# Patient Record
Sex: Female | Born: 1965
Health system: Southern US, Community
[De-identification: ages and names within clinical notes are randomized; demographics above are authoritative.]

## PROBLEM LIST (undated history)

## (undated) DIAGNOSIS — Z9889 Other specified postprocedural states: Secondary | ICD-10-CM

## (undated) DIAGNOSIS — M1990 Unspecified osteoarthritis, unspecified site: Secondary | ICD-10-CM

## (undated) DIAGNOSIS — R7303 Prediabetes: Secondary | ICD-10-CM

## (undated) DIAGNOSIS — G4730 Sleep apnea, unspecified: Secondary | ICD-10-CM

## (undated) DIAGNOSIS — I10 Essential (primary) hypertension: Secondary | ICD-10-CM

---

## 2017-02-07 ENCOUNTER — Other Ambulatory Visit: Payer: Self-pay | Admitting: Internal Medicine

## 2017-02-07 DIAGNOSIS — C73 Malignant neoplasm of thyroid gland: Secondary | ICD-10-CM

## 2017-02-10 ENCOUNTER — Ambulatory Visit
Admission: RE | Admit: 2017-02-10 | Discharge: 2017-02-10 | Disposition: A | Discharge status: Home / Self Care | Payer: BLUE CROSS/BLUE SHIELD | Source: Ambulatory Visit | Attending: Internal Medicine | Admitting: Internal Medicine

## 2017-02-10 DIAGNOSIS — C73 Malignant neoplasm of thyroid gland: Secondary | ICD-10-CM

## 2017-02-18 ENCOUNTER — Other Ambulatory Visit (HOSPITAL_COMMUNITY): Payer: Self-pay | Admitting: Internal Medicine

## 2017-02-18 DIAGNOSIS — C73 Malignant neoplasm of thyroid gland: Secondary | ICD-10-CM

## 2017-02-21 ENCOUNTER — Encounter (HOSPITAL_COMMUNITY): Payer: BLUE CROSS/BLUE SHIELD

## 2017-02-22 ENCOUNTER — Encounter (HOSPITAL_COMMUNITY): Payer: BLUE CROSS/BLUE SHIELD

## 2017-02-23 ENCOUNTER — Encounter (HOSPITAL_COMMUNITY): Payer: BLUE CROSS/BLUE SHIELD

## 2017-03-02 ENCOUNTER — Encounter (HOSPITAL_COMMUNITY)
Admission: RE | Admit: 2017-03-02 | Discharge: 2017-03-02 | Disposition: A | Discharge status: Home / Self Care | Payer: BLUE CROSS/BLUE SHIELD | Source: Ambulatory Visit | Attending: Internal Medicine | Admitting: Internal Medicine

## 2017-03-02 DIAGNOSIS — C73 Malignant neoplasm of thyroid gland: Secondary | ICD-10-CM | POA: Diagnosis not present

## 2017-03-02 MED ORDER — THYROTROPIN ALFA 1.1 MG IM SOLR
0.9000 mg | INTRAMUSCULAR | Status: AC
  Administered 2017-03-02: 0.9 mg via INTRAMUSCULAR

## 2017-03-02 MED ORDER — STERILE WATER FOR INJECTION IJ SOLN
INTRAMUSCULAR | Status: AC
  Filled 2017-03-02: qty 10

## 2017-03-03 ENCOUNTER — Encounter (HOSPITAL_COMMUNITY)
Admission: RE | Admit: 2017-03-03 | Discharge: 2017-03-03 | Disposition: A | Discharge status: Home / Self Care | Payer: BLUE CROSS/BLUE SHIELD | Source: Ambulatory Visit | Attending: Internal Medicine | Admitting: Internal Medicine

## 2017-03-03 DIAGNOSIS — C73 Malignant neoplasm of thyroid gland: Secondary | ICD-10-CM | POA: Diagnosis not present

## 2017-03-03 MED ORDER — STERILE WATER FOR INJECTION IJ SOLN
INTRAMUSCULAR | Status: AC
Start: 2017-03-03 — End: 2017-03-03
  Filled 2017-03-03: qty 10

## 2017-03-03 MED ORDER — THYROTROPIN ALFA 1.1 MG IM SOLR
0.9000 mg | INTRAMUSCULAR | Status: AC
  Administered 2017-03-03: 0.9 mg via INTRAMUSCULAR

## 2017-03-04 ENCOUNTER — Ambulatory Visit (HOSPITAL_COMMUNITY): Payer: BLUE CROSS/BLUE SHIELD

## 2017-03-04 ENCOUNTER — Encounter (HOSPITAL_COMMUNITY)
Admission: RE | Admit: 2017-03-04 | Discharge: 2017-03-04 | Disposition: A | Discharge status: Home / Self Care | Payer: BLUE CROSS/BLUE SHIELD | Source: Ambulatory Visit | Attending: Internal Medicine | Admitting: Internal Medicine

## 2017-03-04 DIAGNOSIS — C73 Malignant neoplasm of thyroid gland: Secondary | ICD-10-CM | POA: Diagnosis not present

## 2017-03-04 LAB — HCG, SERUM, QUALITATIVE: Preg, Serum: NEGATIVE

## 2017-03-04 MED ORDER — SODIUM IODIDE I 131 CAPSULE
100.3000 | Freq: Once | INTRAVENOUS | Status: AC | PRN
  Administered 2017-03-04: 100.3 via ORAL

## 2017-03-14 ENCOUNTER — Encounter (HOSPITAL_COMMUNITY)
Admission: RE | Admit: 2017-03-14 | Discharge: 2017-03-14 | Disposition: A | Discharge status: Home / Self Care | Payer: BLUE CROSS/BLUE SHIELD | Source: Ambulatory Visit | Attending: Internal Medicine | Admitting: Internal Medicine

## 2017-03-14 DIAGNOSIS — C73 Malignant neoplasm of thyroid gland: Secondary | ICD-10-CM | POA: Insufficient documentation

## 2018-11-30 IMAGING — NM NM [ID] THYROID CANCER METS SP CA TX
6 series · 6 of 6 positions shown · non-contrast
Comparison: None.

CLINICAL DATA: Papillary thyroid carcinoma. 51-year-old female. TNM
staging pT3a N0. 6.5 cm greatest tumor dimension with extrathyroidal
extension, lymphovascular invasion, and positive margins.

EXAM:
NUCLEAR MEDICINE 5-3X3 POST THERAPY WHOLE BODY SCAN
TECHNIQUE: The patient received 100.3 mCi 5-3X3 sodium iodide for the treatment
of thyroid cancer within the past 10 days. The patient returns
today, and whole body scanning was performed in the anterior and
posterior projections.

[Series 1: marker · 4.14mm/px · 1 of 1 slices shown (1 of 2)]
[im 1/1  full-range]
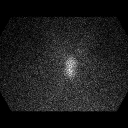

[Series 1: marker · 4.14mm/px · 1 of 1 slices shown (2 of 2)]
[im 1/1]
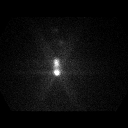

[Series 2: static thyroid no marker · 4.14mm/px · 1 of 1 slices shown (1 of 2)]
[im 1/1]
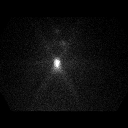

[Series 2: static thyroid no marker · 4.14mm/px · 1 of 1 slices shown (2 of 2)]
[im 1/1  full-range]
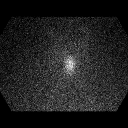

[Series 3: i131 whole body · 2.66mm/px · 1 of 1 slices shown (1 of 2)]
[im 1/1  full-range]
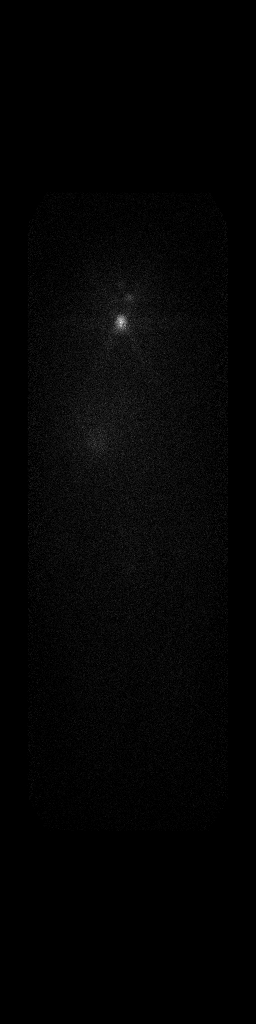

[Series 3: i131 whole body · 2.66mm/px · 1 of 1 slices shown (2 of 2)]
[im 1/1  full-range]
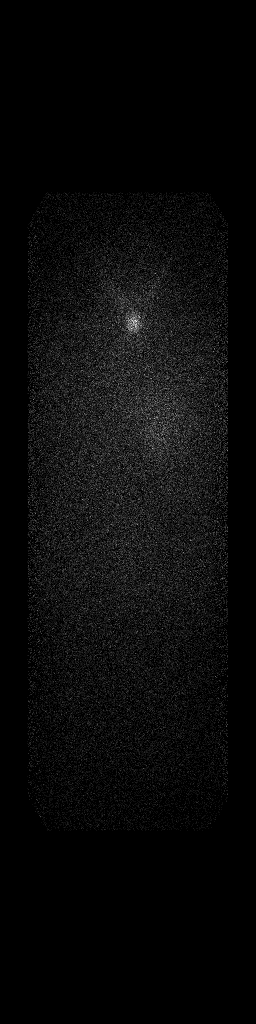

[6 of 6 positions shown; findings below may reference images not displayed]

FINDINGS: Focal activity in thyroid bed consistent remnant tissue. No clear
evidence metastatic nodes in the neck.

No abnormal accumulation of radiotracer on whole-body scan to
suggest distant metastasis. Physiologic uptake noted in the liver.
IMPRESSION: 1. No evidence distant thyroid cancer metastatic disease.
2. Remnant activity in thyroid bed.

## 2021-01-12 ENCOUNTER — Other Ambulatory Visit: Payer: Self-pay | Admitting: Internal Medicine

## 2021-01-12 DIAGNOSIS — C73 Malignant neoplasm of thyroid gland: Secondary | ICD-10-CM

## 2021-02-02 ENCOUNTER — Ambulatory Visit
Admission: RE | Admit: 2021-02-02 | Discharge: 2021-02-02 | Disposition: A | Discharge status: Home / Self Care | Payer: No Typology Code available for payment source | Source: Ambulatory Visit | Attending: Internal Medicine | Admitting: Internal Medicine

## 2021-02-02 ENCOUNTER — Ambulatory Visit
Admission: RE | Admit: 2021-02-02 | Discharge: 2021-02-02 | Disposition: A | Discharge status: Home / Self Care | Payer: Self-pay | Source: Ambulatory Visit | Attending: Internal Medicine | Admitting: Internal Medicine

## 2021-02-02 DIAGNOSIS — C73 Malignant neoplasm of thyroid gland: Secondary | ICD-10-CM

## 2021-02-02 MED ORDER — IOPAMIDOL (ISOVUE-300) INJECTION 61%
75.0000 mL | Freq: Once | INTRAVENOUS | Status: AC | PRN
  Administered 2021-02-02: 75 mL via INTRAVENOUS

## 2021-03-19 ENCOUNTER — Other Ambulatory Visit: Payer: Self-pay | Admitting: Otolaryngology

## 2021-03-19 NOTE — Pre-Procedure Instructions (Signed)
Surgical Instructions ? ? ? Your procedure is scheduled on Wednesday 03/25/21. ? ? Report to [facility] "A" at 09:30 A.M., then check in with the Admitting office. ? Call this number if you have problems the morning of surgery: ? [phone] ? ? If you have any questions prior to your surgery date call [phone]: Open Monday-Friday 8am-4pm ? ? ? Remember: ? Do not eat after midnight the night before your surgery ? ?You may drink clear liquids until 08:30 A.M. the morning of your surgery.   ?Clear liquids allowed are: Water, Non-Citrus Juices (without pulp), Carbonated Beverages, Clear Tea, Black Coffee ONLY (NO MILK, CREAM OR POWDERED CREAMER of any kind), and Gatorade ?  ? Take these medicines the morning of surgery with A SIP OF WATER:  ? levothyroxine (SYNTHROID) ? ? ?As of today, STOP taking any Aspirin (unless otherwise instructed by your surgeon) Aleve, Naproxen, Ibuprofen, Motrin, Advil, Goody's, BC's, all herbal medications, fish oil, and all vitamins. ? ?WHAT DO I DO ABOUT MY DIABETES MEDICATION? ? ? ?Do not take oral diabetes medicines (pills) the morning of surgery. ? ?DO NOT TAKE metFORMIN (GLUCOPHAGE) the morning of surgery.      ? ? ?The day of surgery, do not take other diabetes injectables, including Byetta (exenatide), Bydureon (exenatide ER), Victoza (liraglutide), or Trulicity (dulaglutide). ? ? ?HOW TO MANAGE YOUR DIABETES ?BEFORE AND AFTER SURGERY ? ?Why is it important to control my blood sugar before and after surgery? ?Improving blood sugar levels before and after surgery helps healing and can limit problems. ?A way of improving blood sugar control is eating a healthy diet by: ? Eating less sugar and carbohydrates ? Increasing activity/exercise ? Talking with your doctor about reaching your blood sugar goals ?High blood sugars (greater than 180 mg/dL) can raise your risk of infections and slow your recovery, so you will need to focus on controlling your diabetes during the  weeks before surgery. ?Make sure that the doctor who takes care of your diabetes knows about your planned surgery including the date and location. ? ?How do I manage my blood sugar before surgery? ?Check your blood sugar at least 4 times a day, starting 2 days before surgery, to make sure that the level is not too high or low. ? ?Check your blood sugar the morning of your surgery when you wake up and every 2 hours until you get to the Short Stay unit. ? ?If your blood sugar is less than 70 mg/dL, you will need to treat for low blood sugar: ?Do not take insulin. ?Treat a low blood sugar (less than 70 mg/dL) with ? cup of clear juice (cranberry or apple), 4 glucose tablets, OR glucose gel. ?Recheck blood sugar in 15 minutes after treatment (to make sure it is greater than 70 mg/dL). If your blood sugar is not greater than 70 mg/dL on recheck, call [phone] for further instructions. ?Report your blood sugar to the short stay nurse when you get to Short Stay. ? ?If you are admitted to the hospital after surgery: ?Your blood sugar will be checked by the staff and you will probably be given insulin after surgery (instead of oral diabetes medicines) to make sure you have good blood sugar levels. ?The goal for blood sugar control after surgery is 80-180 mg/dL.  ?         ?Do not wear jewelry or makeup ?Do not wear lotions, powders, perfumes/colognes, or deodorant. ?Do not shave 48 hours prior to surgery.  Men may shave face and neck. ?Do not bring valuables to the hospital. ?Do not wear nail polish, gel polish, artificial nails, or any other type of covering on natural nails (fingers and toes) ?If you have artificial nails or gel coating that need to be removed by a nail salon, please have this removed prior to surgery. Artificial nails or gel coating may interfere with anesthesia's ability to adequately monitor your vital signs. ? ?[facility] is not responsible for any belongings or valuables. .  ? ?Do NOT Smoke  (Tobacco/Vaping)  24 hours prior to your procedure ? ?If you use a CPAP at night, you may bring your mask for your overnight stay. ?  ?Contacts, glasses, hearing aids, dentures or partials may not be worn into surgery, please bring cases for these belongings ?  ?For patients admitted to the hospital, discharge time will be determined by your treatment team. ?  ?Patients discharged the day of surgery will not be allowed to drive home, and someone needs to stay with them for 24 hours. ? ?NO VISITORS WILL BE ALLOWED IN PRE-OP WHERE PATIENTS ARE PREPPED FOR SURGERY.  ONLY 1 SUPPORT PERSON MAY BE PRESENT IN THE WAITING ROOM WHILE YOU ARE IN SURGERY.  IF YOU ARE TO BE ADMITTED, ONCE YOU ARE IN YOUR ROOM YOU WILL BE ALLOWED TWO (2) VISITORS. 1 (ONE) VISITOR MAY STAY OVERNIGHT BUT MUST ARRIVE TO THE ROOM BY 8pm.  Minor children may have two parents present. Special consideration for safety and communication needs will be reviewed on a case by case basis. ? ?Special instructions:   ? ?Oral Hygiene is also important to reduce your risk of infection.  Remember - BRUSH YOUR TEETH THE MORNING OF SURGERY WITH YOUR REGULAR TOOTHPASTE ? ? ?[facility]- Preparing For Surgery ? ?Before surgery, you can play an important role. Because skin is not sterile, your skin needs to be as free of germs as possible. You can reduce the number of germs on your skin by washing with CHG (chlorahexidine gluconate) Soap before surgery.  CHG is an antiseptic cleaner which kills germs and bonds with the skin to continue killing germs even after washing.   ? ? ?Please do not use if you have an allergy to CHG or antibacterial soaps. If your skin becomes reddened/irritated stop using the CHG.  ?Do not shave (including legs and underarms) for at least 48 hours prior to first CHG shower. It is OK to shave your face. ? ?Please follow these instructions carefully. ?  ? ? Shower the NIGHT BEFORE SURGERY and the MORNING OF SURGERY with CHG Soap.  ? If you chose  to wash your hair, wash your hair first as usual with your normal shampoo. After you shampoo, rinse your hair and body thoroughly to remove the shampoo.  Then [employer] and genitals (private parts) with your normal soap and rinse thoroughly to remove soap. ? ?After that Use CHG Soap as you would any other liquid soap. You can apply CHG directly to the skin and wash gently with a scrungie or a clean washcloth.  ? ?Apply the CHG Soap to your body ONLY FROM THE NECK DOWN.  Do not use on open wounds or open sores. Avoid contact with your eyes, ears, mouth and genitals (private parts). Wash Face and genitals (private parts)  with your normal soap.  ? ?Wash thoroughly, paying special attention to the area where your surgery will be performed. ? ?Thoroughly rinse your body with warm water from  the neck down. ? ?DO NOT shower/wash with your normal soap after using and rinsing off the CHG Soap. ? ?Pat yourself dry with a CLEAN TOWEL. ? ?Wear CLEAN PAJAMAS to bed the night before surgery ? ?Place CLEAN SHEETS on your bed the night before your surgery ? ?DO NOT SLEEP WITH PETS. ? ? ?Day of Surgery: ? ?Take a shower with CHG soap. ?Wear Clean/Comfortable clothing the morning of surgery ?Do not apply any deodorants/lotions.   ?Remember to brush your teeth WITH YOUR REGULAR TOOTHPASTE. ? ? ? ?COVID testing ? ?If you are going to stay overnight or be admitted after your procedure/surgery and require a pre-op COVID test, please follow these instructions after your COVID test  ? ?You are not required to quarantine however you are required to wear a well-fitting mask when you are out and around people not in your household.  If your mask becomes wet or soiled, replace with a new one. ? ?Wash your hands often with soap and water for 20 seconds or clean your hands with an alcohol-based hand sanitizer that contains at least 60% alcohol. ? ?Do not share personal items. ? ?Notify your provider: ?if you are in close contact with someone  who has COVID  ?or if you develop a fever of 100.4 or greater, sneezing, cough, sore throat, shortness of breath or body aches. ? ?  ?Please read over the following fact sheets that you were given.  ? ?

## 2021-03-20 ENCOUNTER — Other Ambulatory Visit: Payer: Self-pay

## 2021-03-20 ENCOUNTER — Encounter (HOSPITAL_COMMUNITY)
Admission: RE | Admit: 2021-03-20 | Discharge: 2021-03-20 | Disposition: A | Discharge status: Home / Self Care | Payer: Managed Care, Other (non HMO) | Source: Ambulatory Visit | Attending: Otolaryngology | Admitting: Otolaryngology

## 2021-03-20 ENCOUNTER — Encounter (HOSPITAL_COMMUNITY): Payer: Self-pay

## 2021-03-20 VITALS — BP 139/82 | HR 91 | Temp 98.4°F | Resp 18 | Ht 65.0 in | Wt 245.2 lb

## 2021-03-20 DIAGNOSIS — Z20822 Contact with and (suspected) exposure to covid-19: Secondary | ICD-10-CM | POA: Insufficient documentation

## 2021-03-20 DIAGNOSIS — Z01818 Encounter for other preprocedural examination: Secondary | ICD-10-CM | POA: Insufficient documentation

## 2021-03-20 HISTORY — DX: Other specified postprocedural states: Z98.890

## 2021-03-20 HISTORY — DX: Essential (primary) hypertension: I10

## 2021-03-20 HISTORY — DX: Unspecified osteoarthritis, unspecified site: M19.90

## 2021-03-20 HISTORY — DX: Prediabetes: R73.03

## 2021-03-20 HISTORY — DX: Sleep apnea, unspecified: G47.30

## 2021-03-20 LAB — BASIC METABOLIC PANEL
Anion gap: 9 (ref 5–15)
BUN: 14 mg/dL (ref 6–20)
CO2: 27 mmol/L (ref 22–32)
Calcium: 9.7 mg/dL (ref 8.9–10.3)
Chloride: 104 mmol/L (ref 98–111)
Creatinine, Ser: 0.63 mg/dL (ref 0.44–1.00)
GFR, Estimated: >60 mL/min (ref >60)
Glucose, Bld: 100 mg/dL — ABNORMAL HIGH (ref 70–99)
Potassium: 4 mmol/L (ref 3.5–5.1)
Sodium: 140 mmol/L (ref 135–145)

## 2021-03-20 LAB — CBC
HCT: 43.5 % (ref 36.0–46.0)
Hemoglobin: 14 g/dL (ref 12.0–15.0)
MCH: 28.6 pg (ref 26.0–34.0)
MCHC: 32.2 g/dL (ref 30.0–36.0)
MCV: 89 fL (ref 80.0–100.0)
Platelets: 204 10*3/uL (ref 150–400)
RBC: 4.89 MIL/uL (ref 3.87–5.11)
RDW: 13 % (ref 11.5–15.5)
WBC: 9.7 10*3/uL (ref 4.0–10.5)
nRBC: 0 % (ref 0.0–0.2)

## 2021-03-20 NOTE — Pre-Procedure Instructions (Signed)
Surgical Instructions ? ? ? Your procedure is scheduled on Wednesday 03/25/21. ? ? [facility] "[facility]" at 09:30 A.M., then check in with the Admitting office. ? Call this number if you have problems the morning of surgery: ? [phone] ? ? If you have any questions prior to your surgery date call [phone]: Open Monday-Friday 8am-4pm ? ? ? Remember: ? Do not eat or drink after midnight the night before your surgery ? ? Take these medicines the morning of surgery with A SIP OF WATER:  ? ? levothyroxine (SYNTHROID) ? ? ?As of today, STOP taking any Aspirin (unless otherwise instructed by your surgeon) Aleve, Naproxen, Ibuprofen, Motrin, Advil, Goody's, BC's, all herbal medications, fish oil, and all vitamins. ? ?WHAT DO I DO ABOUT MY DIABETES MEDICATION? ? ? ?Do not take oral diabetes medicines (pills) the morning of surgery. ? ?DO NOT TAKE metFORMIN (GLUCOPHAGE) the morning of surgery.     ? ? ?HOW TO MANAGE YOUR DIABETES ?BEFORE AND AFTER SURGERY ? ?Why is it important to control my blood sugar before and after surgery? ?Improving blood sugar levels before and after surgery helps healing and can limit problems. ?A way of improving blood sugar control is eating a healthy diet by: ? Eating less sugar and carbohydrates ? Increasing activity/exercise ? Talking with your doctor about reaching your blood sugar goals ?High blood sugars (greater than 180 mg/dL) can raise your risk of infections and slow your recovery, so you will need to focus on controlling your diabetes during the weeks before surgery. ?Make sure that the doctor who takes care of your diabetes knows about your planned surgery including the date and location. ? ?How do I manage my blood sugar before surgery? ?Check your blood sugar at least 4 times a day, starting 2 days before surgery, to make sure that the level is not too high or low. ? ?Check your blood sugar the morning of your surgery when you wake up and every 2 hours until  you get to the Short Stay unit. ? ?If your blood sugar is less than 70 mg/dL, you will need to treat for low blood sugar: ?Do not take insulin. ?Treat a low blood sugar (less than 70 mg/dL) with ? cup of clear juice (cranberry or apple), 4 glucose tablets, OR glucose gel. ?Recheck blood sugar in 15 minutes after treatment (to make sure it is greater than 70 mg/dL). If your blood sugar is not greater than 70 mg/dL on recheck, call [phone] for further instructions. ?Report your blood sugar to the short stay nurse when you get to Short Stay. ? ?If you are admitted to the hospital after surgery: ?Your blood sugar will be checked by the staff and you will probably be given insulin after surgery (instead of oral diabetes medicines) to make sure you have good blood sugar levels. ?The goal for blood sugar control after surgery is 80-180 mg/dL.  ? ?The day of surgery: ?         ?Do not wear jewelry or makeup ?Do not wear lotions, powders, perfumes, or deodorant. ?Do not shave 48 hours prior to surgery.   ?Do not bring valuables to the hospital. ?Do not wear nail polish, gel polish, artificial nails, or any other type of covering on natural nails (fingers and toes) ?If you have artificial nails or gel coating that need to be removed by a nail salon, please have this removed prior to surgery. Artificial nails or gel coating may interfere  with anesthesia's ability to adequately monitor your vital signs. ? ?[facility] is not responsible for any belongings or valuables. .  ? ?Do NOT Smoke (Tobacco/Vaping)  24 hours prior to your procedure ? ?If you use a CPAP at night, you may bring your mask for your overnight stay. ?  ?Contacts, glasses, hearing aids, dentures or partials may not be worn into surgery, please bring cases for these belongings ?  ?For patients admitted to the hospital, discharge time will be determined by your treatment team. ?  ?Patients discharged the day of surgery will not be allowed to drive home, and  someone needs to stay with them for 24 hours. ? ?NO VISITORS WILL BE ALLOWED IN PRE-OP WHERE PATIENTS ARE PREPPED FOR SURGERY.  ONLY 1 SUPPORT PERSON MAY BE PRESENT IN THE WAITING ROOM WHILE YOU ARE IN SURGERY.  IF YOU ARE TO BE ADMITTED, ONCE YOU ARE IN YOUR ROOM YOU WILL BE ALLOWED TWO (2) VISITORS. 1 (ONE) VISITOR MAY STAY OVERNIGHT BUT MUST ARRIVE TO THE ROOM BY 8pm.  Minor children may have two parents present. Special consideration for safety and communication needs will be reviewed on a case by case basis. ? ?Special instructions:   ? ?Oral Hygiene is also important to reduce your risk of infection.  Remember - BRUSH YOUR TEETH THE MORNING OF SURGERY WITH YOUR REGULAR TOOTHPASTE ? ? ?[facility]- Preparing For Surgery ? ?Before surgery, you can play an important role. Because skin is not sterile, your skin needs to be as free of germs as possible. You can reduce the number of germs on your skin by washing with CHG (chlorahexidine gluconate) Soap before surgery.  CHG is an antiseptic cleaner which kills germs and bonds with the skin to continue killing germs even after washing.   ? ? ?Please do not use if you have an allergy to CHG or antibacterial soaps. If your skin becomes reddened/irritated stop using the CHG.  ?Do not shave (including legs and underarms) for at least 48 hours prior to first CHG shower. It is OK to shave your face. ? ?Please follow these instructions carefully. ?  ? ? Shower the NIGHT BEFORE SURGERY and the MORNING OF SURGERY with CHG Soap.  ? If you chose to wash your hair, wash your hair first as usual with your normal shampoo. After you shampoo, rinse your hair and body thoroughly to remove the shampoo.  Then [employer] and genitals (private parts) with your normal soap and rinse thoroughly to remove soap. ? ?After that Use CHG Soap as you would any other liquid soap. You can apply CHG directly to the skin and wash gently with a scrungie or a clean washcloth.  ? ?Apply the CHG Soap to  your body ONLY FROM THE NECK DOWN.  Do not use on open wounds or open sores. Avoid contact with your eyes, ears, mouth and genitals (private parts). Wash Face and genitals (private parts)  with your normal soap.  ? ?Wash thoroughly, paying special attention to the area where your surgery will be performed. ? ?Thoroughly rinse your body with warm water from the neck down. ? ?DO NOT shower/wash with your normal soap after using and rinsing off the CHG Soap. ? ?Pat yourself dry with a CLEAN TOWEL. ? ?Wear CLEAN PAJAMAS to bed the night before surgery ? ?Place CLEAN SHEETS on your bed the night before your surgery ? ?DO NOT SLEEP WITH PETS. ? ? ?Day of Surgery: ? ?Take a shower with  CHG soap. ?Wear Clean/Comfortable clothing the morning of surgery ?Do not apply any deodorants/lotions.   ?Remember to brush your teeth WITH YOUR REGULAR TOOTHPASTE. ? ? ? ?COVID testing ? ?If you are going to stay overnight or be admitted after your procedure/surgery and require a pre-op COVID test, please follow these instructions after your COVID test  ? ?You are not required to quarantine however you are required to wear a well-fitting mask when you are out and around people not in your household.  If your mask becomes wet or soiled, replace with a new one. ? ?Wash your hands often with soap and water for 20 seconds or clean your hands with an alcohol-based hand sanitizer that contains at least 60% alcohol. ? ?Do not share personal items. ? ?Notify your provider: ?if you are in close contact with someone who has COVID  ?or if you develop a fever of 100.4 or greater, sneezing, cough, sore throat, shortness of breath or body aches. ? ?  ?Please read over the following fact sheets that you were given.  ? ?

## 2021-03-20 NOTE — Progress Notes (Addendum)
PCP - Glendale Chard, MD ?Cardiologist - denies ? ?PPM/ICD - denies ?Device Orders - n/a ?Rep Notified - n/a ? ?Chest x-ray - n/a ?EKG - 03/20/2021 ?Stress Test - > 10 years ago - negative per patient ?ECHO - 06/25/2014 - [facility] ?Cardiac Cath - denies ? ?Sleep Study - yes, positive for OSA ?CPAP - yes ? ?Blood Thinner Instructions: n/a ? ?Aspirin Instructions: Patient was instructed: As of today, STOP taking any Aspirin (unless otherwise instructed by your surgeon) Aleve, Naproxen, Ibuprofen, Motrin, Advil, Goody's, BC's, all herbal medications, fish oil, and all vitamins. ?  ? ?ERAS Protcol - n/a ?  ?COVID TEST - done in PAT on 03/20/2021 ? ? ?Anesthesia review: yes, abnormal EKG in PAT ? ?Patient denies shortness of breath, fever, cough and chest pain at PAT appointment ? ? ?All instructions explained to the patient, with a verbal understanding of the material. Patient agrees to go over the instructions while at home for a better understanding. Patient also instructed to self quarantine after being tested for COVID-19. The opportunity to ask questions was provided. ?  ?

## 2021-03-21 LAB — SARS CORONAVIRUS 2 (TAT 6-24 HRS): SARS Coronavirus 2: NEGATIVE

## 2021-03-24 NOTE — Anesthesia Preprocedure Evaluation (Addendum)
Anesthesia Evaluation  ?Patient identified by MRN, date of birth, ID band ?Patient awake ? ? ? ?Reviewed: ?Allergy & Precautions, NPO status , Patient's Chart, lab work & pertinent test results ? ?Airway ?Mallampati: III ? ?TM Distance: >3 FB ?Neck ROM: Full ? ? ? Dental ? ?(+) Teeth Intact, Dental Advisory Given ?  ?Pulmonary ?sleep apnea , former smoker,  ?  ?Pulmonary exam normal ? ? ? ? ? ? ? Cardiovascular ?hypertension, Pt. on medications ?Normal cardiovascular exam ? ? ?  ?Neuro/Psych ?negative neurological ROS ? negative psych ROS  ? GI/Hepatic ?negative GI ROS, Neg liver ROS,   ?Endo/Other  ?diabetes, Type 2, Oral Hypoglycemic AgentsHypothyroidism Morbid obesityRecurrent follicular carcinoma of thyroid gland ?BMI 41 ? Renal/GU ?negative Renal ROS  ?negative genitourinary ?  ?Musculoskeletal ? ?(+) Arthritis , Osteoarthritis,   ? Abdominal ?  ?Peds ? Hematology ?negative hematology ROS ?(+)   ?Anesthesia Other Findings ? ? Reproductive/Obstetrics ?negative OB ROS ? ?  ? ? ? ? ? ? ? ? ? ? ? ? ? ?  ?  ? ? ? ? ? ? ? ?Anesthesia Physical ?Anesthesia Plan ? ?ASA: 3 ? ?Anesthesia Plan: General  ? ?Post-op Pain Management: Tylenol PO (pre-op)* and Toradol IV (intra-op)*  ? ?Induction: Intravenous ? ?PONV Risk Score and Plan: 4 or greater and Ondansetron, Dexamethasone, Midazolam and Treatment may vary due to age or medical condition ? ?Airway Management Planned: Oral ETT ? ?Additional Equipment: None ? ?Intra-op Plan:  ? ?Post-operative Plan: Extubation in OR ? ?Informed Consent: I have reviewed the patients History and Physical, chart, labs and discussed the procedure including the risks, benefits and alternatives for the proposed anesthesia with the patient or authorized representative who has indicated his/her understanding and acceptance.  ? ? ? ?Dental advisory given ? ?Plan Discussed with:  ? ?Anesthesia Plan Comments:   ? ? ? ? ? ?Anesthesia Quick Evaluation ? ?

## 2021-03-25 ENCOUNTER — Ambulatory Visit (HOSPITAL_BASED_OUTPATIENT_CLINIC_OR_DEPARTMENT_OTHER): Payer: Managed Care, Other (non HMO) | Admitting: Anesthesiology

## 2021-03-25 ENCOUNTER — Ambulatory Visit (HOSPITAL_COMMUNITY): Payer: Managed Care, Other (non HMO) | Admitting: Physician Assistant

## 2021-03-25 ENCOUNTER — Encounter (HOSPITAL_COMMUNITY)
Admission: RE | Disposition: A | Discharge status: Home / Self Care | Payer: Self-pay | Source: Home / Self Care | Attending: Otolaryngology

## 2021-03-25 ENCOUNTER — Other Ambulatory Visit: Payer: Self-pay

## 2021-03-25 ENCOUNTER — Ambulatory Visit (HOSPITAL_COMMUNITY)
Admission: RE | Admit: 2021-03-25 | Discharge: 2021-03-25 | Disposition: A | Discharge status: Home / Self Care | Payer: Managed Care, Other (non HMO) | Attending: Otolaryngology | Admitting: Otolaryngology

## 2021-03-25 ENCOUNTER — Encounter (HOSPITAL_COMMUNITY): Payer: Self-pay | Admitting: Otolaryngology

## 2021-03-25 DIAGNOSIS — Z87891 Personal history of nicotine dependence: Secondary | ICD-10-CM | POA: Insufficient documentation

## 2021-03-25 DIAGNOSIS — I10 Essential (primary) hypertension: Secondary | ICD-10-CM | POA: Diagnosis not present

## 2021-03-25 DIAGNOSIS — E890 Postprocedural hypothyroidism: Secondary | ICD-10-CM | POA: Insufficient documentation

## 2021-03-25 DIAGNOSIS — Z6841 Body Mass Index (BMI) 40.0 and over, adult: Secondary | ICD-10-CM | POA: Insufficient documentation

## 2021-03-25 DIAGNOSIS — C73 Malignant neoplasm of thyroid gland: Secondary | ICD-10-CM

## 2021-03-25 DIAGNOSIS — Z85850 Personal history of malignant neoplasm of thyroid: Secondary | ICD-10-CM | POA: Insufficient documentation

## 2021-03-25 DIAGNOSIS — D34 Benign neoplasm of thyroid gland: Secondary | ICD-10-CM | POA: Insufficient documentation

## 2021-03-25 DIAGNOSIS — Z7984 Long term (current) use of oral hypoglycemic drugs: Secondary | ICD-10-CM

## 2021-03-25 DIAGNOSIS — E119 Type 2 diabetes mellitus without complications: Secondary | ICD-10-CM

## 2021-03-25 HISTORY — PX: THYROIDECTOMY: SHX17

## 2021-03-25 LAB — CALCIUM: Calcium: 8.8 mg/dL — ABNORMAL LOW (ref 8.9–10.3)

## 2021-03-25 SURGERY — THYROIDECTOMY
Anesthesia: General | Site: Neck | Laterality: Right

## 2021-03-25 MED ORDER — DEXAMETHASONE SODIUM PHOSPHATE 4 MG/ML IJ SOLN
INTRAMUSCULAR | Status: DC | PRN
  Administered 2021-03-25: 10 mg via INTRAVENOUS

## 2021-03-25 MED ORDER — HYDROMORPHONE HCL 1 MG/ML IJ SOLN
0.2500 mg | INTRAMUSCULAR | Status: DC | PRN
  Administered 2021-03-25: 0.5 mg via INTRAVENOUS

## 2021-03-25 MED ORDER — KCL IN DEXTROSE-NACL 20-5-0.45 MEQ/L-%-% IV SOLN
INTRAVENOUS | Status: DC
  Filled 2021-03-25: qty 1000

## 2021-03-25 MED ORDER — ORAL CARE MOUTH RINSE: 15.0000 mL | Freq: Once | OROMUCOSAL | Status: AC

## 2021-03-25 MED ORDER — 0.9 % SODIUM CHLORIDE (POUR BTL) OPTIME
TOPICAL | Status: DC | PRN
  Administered 2021-03-25: 1000 mL

## 2021-03-25 MED ORDER — OXYCODONE HCL 5 MG/5ML PO SOLN: 5.0000 mg | Freq: Once | ORAL | Status: DC | PRN

## 2021-03-25 MED ORDER — MIDAZOLAM HCL 2 MG/2ML IJ SOLN
INTRAMUSCULAR | Status: AC
  Filled 2021-03-25: qty 2

## 2021-03-25 MED ORDER — ACETAMINOPHEN 500 MG PO TABS
1000.0000 mg | ORAL_TABLET | Freq: Once | ORAL | Status: AC
  Administered 2021-03-25: 1000 mg via ORAL
  Filled 2021-03-25: qty 2

## 2021-03-25 MED ORDER — AMISULPRIDE (ANTIEMETIC) 5 MG/2ML IV SOLN: 10.0000 mg | Freq: Once | INTRAVENOUS | Status: DC | PRN

## 2021-03-25 MED ORDER — KETOROLAC TROMETHAMINE 30 MG/ML IJ SOLN
INTRAMUSCULAR | Status: AC
  Filled 2021-03-25: qty 1

## 2021-03-25 MED ORDER — LIDOCAINE-EPINEPHRINE 1 %-1:100000 IJ SOLN
INTRAMUSCULAR | Status: DC | PRN
  Administered 2021-03-25: 5 mL

## 2021-03-25 MED ORDER — ONDANSETRON HCL 4 MG/2ML IJ SOLN
4.0000 mg | INTRAMUSCULAR | Status: DC | PRN
  Administered 2021-03-25: 4 mg via INTRAVENOUS
  Filled 2021-03-25: qty 2

## 2021-03-25 MED ORDER — HYDROCODONE-ACETAMINOPHEN 5-325 MG PO TABS
1.0000 | ORAL_TABLET | ORAL | Status: DC | PRN
  Administered 2021-03-25: 2 via ORAL
  Filled 2021-03-25: qty 2

## 2021-03-25 MED ORDER — CHLORHEXIDINE GLUCONATE 0.12 % MT SOLN
15.0000 mL | Freq: Once | OROMUCOSAL | Status: AC
  Administered 2021-03-25: 15 mL via OROMUCOSAL
  Filled 2021-03-25: qty 15

## 2021-03-25 MED ORDER — HYDROMORPHONE HCL 1 MG/ML IJ SOLN
INTRAMUSCULAR | Status: AC
  Filled 2021-03-25: qty 1

## 2021-03-25 MED ORDER — CEFAZOLIN SODIUM-DEXTROSE 2-4 GM/100ML-% IV SOLN
2.0000 g | INTRAVENOUS | Status: AC
  Administered 2021-03-25: 2 g via INTRAVENOUS
  Filled 2021-03-25: qty 100

## 2021-03-25 MED ORDER — HYDROCODONE-ACETAMINOPHEN 7.5-325 MG PO TABS: 1.0000 | ORAL_TABLET | Freq: Four times a day (QID) | ORAL | 0 refills | Status: AC | PRN

## 2021-03-25 MED ORDER — LIDOCAINE-EPINEPHRINE 1 %-1:100000 IJ SOLN
INTRAMUSCULAR | Status: AC
  Filled 2021-03-25: qty 1

## 2021-03-25 MED ORDER — MIDAZOLAM HCL 5 MG/5ML IJ SOLN
INTRAMUSCULAR | Status: DC | PRN
  Administered 2021-03-25: 2 mg via INTRAVENOUS

## 2021-03-25 MED ORDER — LACTATED RINGERS IV SOLN
INTRAVENOUS | Status: DC
Start: 2021-03-25 — End: 2021-03-25

## 2021-03-25 MED ORDER — LIDOCAINE 2% (20 MG/ML) 5 ML SYRINGE
INTRAMUSCULAR | Status: DC | PRN
  Administered 2021-03-25: 60 mg via INTRAVENOUS

## 2021-03-25 MED ORDER — OXYCODONE HCL 5 MG PO TABS: 5.0000 mg | ORAL_TABLET | Freq: Once | ORAL | Status: DC | PRN

## 2021-03-25 MED ORDER — PROPOFOL 10 MG/ML IV BOLUS
INTRAVENOUS | Status: DC | PRN
  Administered 2021-03-25: 200 mg via INTRAVENOUS

## 2021-03-25 MED ORDER — PROPOFOL 10 MG/ML IV BOLUS
INTRAVENOUS | Status: AC
  Filled 2021-03-25: qty 20

## 2021-03-25 MED ORDER — IBUPROFEN 100 MG/5ML PO SUSP: 400.0000 mg | Freq: Four times a day (QID) | ORAL | Status: DC | PRN

## 2021-03-25 MED ORDER — FENTANYL CITRATE (PF) 100 MCG/2ML IJ SOLN
INTRAMUSCULAR | Status: DC | PRN
  Administered 2021-03-25: 50 ug via INTRAVENOUS
  Administered 2021-03-25 (×2): 100 ug via INTRAVENOUS

## 2021-03-25 MED ORDER — PHENYLEPHRINE 40 MCG/ML (10ML) SYRINGE FOR IV PUSH (FOR BLOOD PRESSURE SUPPORT)
PREFILLED_SYRINGE | INTRAVENOUS | Status: DC | PRN
  Administered 2021-03-25 (×2): 80 ug via INTRAVENOUS

## 2021-03-25 MED ORDER — ONDANSETRON HCL 4 MG/2ML IJ SOLN
INTRAMUSCULAR | Status: DC | PRN
  Administered 2021-03-25: 4 mg via INTRAVENOUS

## 2021-03-25 MED ORDER — FENTANYL CITRATE (PF) 250 MCG/5ML IJ SOLN
INTRAMUSCULAR | Status: AC
  Filled 2021-03-25: qty 5

## 2021-03-25 MED ORDER — SUCCINYLCHOLINE CHLORIDE 200 MG/10ML IV SOSY
PREFILLED_SYRINGE | INTRAVENOUS | Status: DC | PRN
  Administered 2021-03-25: 120 mg via INTRAVENOUS

## 2021-03-25 MED ORDER — ONDANSETRON HCL 4 MG/2ML IJ SOLN: 4.0000 mg | Freq: Once | INTRAMUSCULAR | Status: DC | PRN

## 2021-03-25 MED ORDER — ONDANSETRON HCL 4 MG PO TABS: 4.0000 mg | ORAL_TABLET | ORAL | Status: DC | PRN

## 2021-03-25 MED ORDER — PHENYLEPHRINE HCL-NACL 20-0.9 MG/250ML-% IV SOLN
INTRAVENOUS | Status: DC | PRN
  Administered 2021-03-25: 75 ug/min via INTRAVENOUS

## 2021-03-25 SURGICAL SUPPLY — 44 items
BAG COUNTER SPONGE SURGICOUNT (BAG) ×2 IMPLANT
BLADE SURG 15 STRL LF DISP TIS (BLADE) IMPLANT
BLADE SURG 15 STRL SS (BLADE)
CANISTER SUCT 3000ML PPV (MISCELLANEOUS) ×2 IMPLANT
CLEANER TIP ELECTROSURG 2X2 (MISCELLANEOUS) ×2 IMPLANT
CNTNR URN SCR LID CUP LEK RST (MISCELLANEOUS) IMPLANT
CONT SPEC 4OZ STRL OR WHT (MISCELLANEOUS)
CORD BIPOLAR FORCEPS 12FT (ELECTRODE) ×2 IMPLANT
COVER SURGICAL LIGHT HANDLE (MISCELLANEOUS) ×2 IMPLANT
DERMABOND ADVANCED (GAUZE/BANDAGES/DRESSINGS) ×1
DERMABOND ADVANCED .7 DNX12 (GAUZE/BANDAGES/DRESSINGS) ×1 IMPLANT
DRAIN JACKSON RD 7FR 3/32 (WOUND CARE) IMPLANT
DRAIN SNY 10 ROU (WOUND CARE) IMPLANT
DRAPE HALF SHEET 40X57 (DRAPES) IMPLANT
ELECT COATED BLADE 2.86 ST (ELECTRODE) ×2 IMPLANT
ELECT REM PT RETURN 9FT ADLT (ELECTROSURGICAL) ×2
ELECTRODE REM PT RTRN 9FT ADLT (ELECTROSURGICAL) ×1 IMPLANT
EVACUATOR SILICONE 100CC (DRAIN) ×2 IMPLANT
FORCEPS BIPOLAR SPETZLER 8 1.0 (NEUROSURGERY SUPPLIES) ×2 IMPLANT
GAUZE 4X4 16PLY ~~LOC~~+RFID DBL (SPONGE) ×2 IMPLANT
GLOVE SURG ENC MOIS LTX SZ7.5 (GLOVE) ×2 IMPLANT
GOWN STRL REUS W/ TWL LRG LVL3 (GOWN DISPOSABLE) ×2 IMPLANT
GOWN STRL REUS W/TWL LRG LVL3 (GOWN DISPOSABLE) ×2
HEMOSTAT SURGICEL 2X14 (HEMOSTASIS) ×2 IMPLANT
KIT BASIN OR (CUSTOM PROCEDURE TRAY) ×2 IMPLANT
KIT TURNOVER KIT B (KITS) ×2 IMPLANT
LOCATOR NERVE 3 VOLT (DISPOSABLE) IMPLANT
NDL HYPO 25GX1X1/2 BEV (NEEDLE) ×1 IMPLANT
NEEDLE HYPO 25GX1X1/2 BEV (NEEDLE) ×2 IMPLANT
NS IRRIG 1000ML POUR BTL (IV SOLUTION) ×2 IMPLANT
PAD ARMBOARD 7.5X6 YLW CONV (MISCELLANEOUS) ×4 IMPLANT
PENCIL SMOKE EVACUATOR (MISCELLANEOUS) ×2 IMPLANT
POSITIONER HEAD DONUT 9IN (MISCELLANEOUS) IMPLANT
SHEARS HARMONIC 9CM CVD (BLADE) ×2 IMPLANT
SPONGE INTESTINAL PEANUT (DISPOSABLE) IMPLANT
STAPLER VISISTAT 35W (STAPLE) ×2 IMPLANT
SUT ETHILON 2 0 FS 18 (SUTURE) ×2 IMPLANT
SUT SILK 3 0 REEL (SUTURE) ×2 IMPLANT
SUT VIC AB 3-0 SH 27 (SUTURE) ×1
SUT VIC AB 3-0 SH 27X BRD (SUTURE) ×1 IMPLANT
SUT VICRYL 4-0 PS2 18IN ABS (SUTURE) ×2 IMPLANT
TOWEL GREEN STERILE FF (TOWEL DISPOSABLE) ×2 IMPLANT
TRAY ENT MC OR (CUSTOM PROCEDURE TRAY) ×2 IMPLANT
TUBE FEEDING 10FR FLEXIFLO (MISCELLANEOUS) IMPLANT

## 2021-03-25 NOTE — Transfer of Care (Signed)
Immediate Anesthesia Transfer of Care Note ? ?Patient: [facility] ? ?Procedure(s) Performed: EXCISION OF THYROID BED MALIGNANT MASS (Right: Neck) ? ?Patient Location: PACU ? ?Anesthesia Type:General ? ?Level of Consciousness: awake and alert  ? ?Airway & Oxygen Therapy: Patient Spontanous Breathing and Patient connected to face mask oxygen ? ?Post-op Assessment: Report given to RN and Post -op Vital signs reviewed and stable ? ?Post vital signs: Reviewed and stable ? ?Last Vitals:  ?Vitals Value Taken Time  ?BP 132/62 03/25/21 1341  ?Temp 36.7 ?C 03/25/21 1340  ?Pulse 87 03/25/21 1344  ?Resp 14 03/25/21 1344  ?SpO2 98 % 03/25/21 1344  ?Vitals shown include unvalidated device data. ? ?Last Pain:  ?Vitals:  ? 03/25/21 0959  ?TempSrc:   ?PainSc: 0-No pain  ?   ? ?  ? ?Complications: No notable events documented. ?

## 2021-03-25 NOTE — Anesthesia Postprocedure Evaluation (Signed)
Anesthesia Post Note ? ?Patient: [facility] ? ?Procedure(s) Performed: EXCISION OF THYROID BED MALIGNANT MASS (Right: Neck) ? ?  ? ?Patient location during evaluation: PACU ?Anesthesia Type: General ?Level of consciousness: awake and alert ?Pain management: pain level controlled ?Vital Signs Assessment: post-procedure vital signs reviewed and stable ?Respiratory status: spontaneous breathing, nonlabored ventilation and respiratory function stable ?Cardiovascular status: blood pressure returned to baseline and stable ?Postop Assessment: no apparent nausea or vomiting ?Anesthetic complications: no ? ? ?No notable events documented. ? ?Last Vitals:  ?Vitals:  ? 03/25/21 1425 03/25/21 1445  ?BP: 120/67 124/72  ?Pulse: 78 85  ?Resp: 16 18  ?Temp:  36.7 ?C  ?SpO2: 95% 97%  ?  ?Last Pain:  ?Vitals:  ? 03/25/21 1445  ?TempSrc: Oral  ?PainSc: 7   ? ? ?  ?  ?  ?  ?  ?  ? ?Lidia Collum ? ? ? ? ?

## 2021-03-25 NOTE — Discharge Summary (Signed)
Physician Discharge Summary  ?Patient ID: ?Amy Ellison ?MRN: [id number] ?DOB/AGE: 1965/09/03 56 y.o. ? ?Admit date: 03/25/2021 ?Discharge date: 03/25/2021 ? ?Admission Diagnoses: Recurrent follicular thyroid cancer ? ?Discharge Diagnoses:  ?Principal Problem: ?  Thyroid cancer ([place]) ? ? ?Discharged Condition: good ? ?Hospital Course: 56 year old female presented for surgical resection of recurrent follicular thyroid cancer.  See operative note.  She was observed after surgery and did well.  Her calcium level was checked and looked fairly good.  She was felt stable for discharge. ? ?Consults: None ? ?Significant Diagnostic Studies: None ? ?Treatments: surgery: Resection of malignant mass from thyroid bed ? ?Discharge Exam: ?Blood pressure 124/72, pulse 85, temperature 98.1 ?F (36.7 ?C), temperature source Oral, resp. rate 18, height '5\' 5"'$  (1.651 m), weight 108.9 kg, SpO2 97 %. ?General appearance: alert, cooperative, and no distress ?Neck: thyroidectomy incision clean and intact ? ?Disposition: Discharge disposition: 01-Home or Self Care ? ? ? ? ? ? ?Discharge Instructions   ? ? Diet - low sodium heart healthy   Complete by: As directed ?  ? Discharge instructions   Complete by: As directed ?  ? Avoid strenuous activity.  Regular diet.  OK to allow incision to get wet, gently pat dry.  Do not apply ointment to incision.  ? Increase activity slowly   Complete by: As directed ?  ? No wound care   Complete by: As directed ?  ? ?  ? ?Allergies as of 03/25/2021   ?No Known Allergies ?  ? ?  ?Medication List  ?  ? ?TAKE these medications   ? ?amLODipine 10 MG tablet ?Commonly known as: NORVASC ?Take 10 mg by mouth at bedtime. ?  ?HYDROcodone-acetaminophen 7.5-325 MG tablet ?Commonly known as: Norco ?Take 1 tablet by mouth every 6 (six) hours as needed for moderate pain. ?  ?levothyroxine 150 MCG tablet ?Commonly known as: SYNTHROID ?Take 150 mcg by mouth daily before breakfast. ?  ?lisinopril-hydrochlorothiazide 20-25 MG  tablet ?Commonly known as: ZESTORETIC ?Take 1 tablet by mouth daily. ?  ?metFORMIN 1000 MG tablet ?Commonly known as: GLUCOPHAGE ?Take 1,000 mg by mouth daily. ?  ?ZINC-VITAMIN C PO ?Take 2 tablets by mouth daily. With vitamin d ?  ? ?  ? ? Follow-up Information   ? ? Melida Quitter, MD. Schedule an appointment as soon as possible for a visit in 1 week(s).   ?Specialty: Otolaryngology ?Contact information: ?[address] ?[address] ?[place] [address] ?[phone] ? ? ?  ?  ? ?  ?  ? ?  ? ? ?Signed: ?Melida Quitter ?03/25/2021, 5:18 PM ? ? ?

## 2021-03-25 NOTE — Anesthesia Procedure Notes (Signed)
Procedure Name: Intubation ?Date/Time: 03/25/2021 12:28 PM ?Performed by: Kyung Rudd, CRNA ?Pre-anesthesia Checklist: Patient identified, Emergency Drugs available, Suction available and Patient being monitored ?Patient Re-evaluated:Patient Re-evaluated prior to induction ?Oxygen Delivery Method: Circle system utilized ?Preoxygenation: Pre-oxygenation with 100% oxygen ?Induction Type: IV induction ?Ventilation: Mask ventilation without difficulty ?Laryngoscope Size: Mac and 4 ?Grade View: Grade I ?Tube type: Oral ?Tube size: 8.0 mm ?Number of attempts: 1 ?Airway Equipment and Method: Stylet and Oral airway ?Placement Confirmation: ETT inserted through vocal cords under direct vision, positive ETCO2 and breath sounds checked- equal and bilateral ?Secured at: 20 cm ?Tube secured with: Tape ?Dental Injury: Teeth and Oropharynx as per pre-operative assessment  ? ? ? ? ?

## 2021-03-25 NOTE — H&P (Signed)
Amy Ellison is an 56 y.o. female.   ?Chief Complaint: Follicular thyroid cancer ?HPI: 56 year old female who underwent total thyroidectomy in late 0017 for follicular cancer followed by radioactive iodine.  She was recently found to have a recurrence in the right thyroid bed. ? ?Past Medical History:  ?Diagnosis Date  ? Arthritis   ? History of thyroid surgery   ? Hypertension   ? Pre-diabetes   ? Sleep apnea   ? ? ?History reviewed. No pertinent surgical history. ? ?History reviewed. No pertinent family history. ?Social History:  reports that she has quit smoking. Her smoking use included cigarettes. She has never used smokeless tobacco. No history on file for alcohol use and drug use. ? ?Allergies: No Known Allergies ? ?Medications Prior to Admission  ?Medication Sig Dispense Refill  ? amLODipine (NORVASC) 10 MG tablet Take 10 mg by mouth at bedtime.    ? levothyroxine (SYNTHROID) 150 MCG tablet Take 150 mcg by mouth daily before breakfast.    ? lisinopril-hydrochlorothiazide (ZESTORETIC) 20-25 MG tablet Take 1 tablet by mouth daily.    ? metFORMIN (GLUCOPHAGE) 1000 MG tablet Take 1,000 mg by mouth daily.    ? ZINC-VITAMIN C PO Take 2 tablets by mouth daily. With vitamin d    ? ? ?No results found for this or any previous visit (from the past 48 hour(s)). ?No results found. ? ?Review of Systems  ?Musculoskeletal:  Positive for arthralgias.  ?All other systems reviewed and are negative. ? ?Blood pressure (!) 150/86, pulse 83, temperature 98.5 ?F (36.9 ?C), temperature source Oral, resp. rate 17, height '5\' 5"'$  (1.651 m), weight 108.9 kg, SpO2 99 %. ?Physical Exam ?Constitutional:   ?   Appearance: Normal appearance.  ?HENT:  ?   Head: Normocephalic and atraumatic.  ?   Right Ear: External ear normal.  ?   Left Ear: External ear normal.  ?   Nose: Nose normal.  ?   Mouth/Throat:  ?   Mouth: Mucous membranes are moist.  ?   Pharynx: Oropharynx is clear.  ?Eyes:  ?   Extraocular Movements: Extraocular movements  intact.  ?   Conjunctiva/sclera: Conjunctivae normal.  ?   Pupils: Pupils are equal, round, and reactive to light.  ?Cardiovascular:  ?   Rate and Rhythm: Normal rate.  ?Pulmonary:  ?   Effort: Pulmonary effort is normal.  ?Musculoskeletal:  ?   Cervical back: Normal range of motion.  ?Skin: ?   General: Skin is warm and dry.  ?Neurological:  ?   General: No focal deficit present.  ?   Mental Status: She is alert and oriented to person, place, and time.  ?Psychiatric:     ?   Mood and Affect: Mood normal.     ?   Behavior: Behavior normal.     ?   Thought Content: Thought content normal.     ?   Judgment: Judgment normal.  ?  ? ?Assessment/Plan ?Recurrent follicular thyroid cancer ? ?To OR for excision of right thyroid bed mass.  Overnight observation. ? ?Melida Quitter, MD ?03/25/2021, 11:52 AM ? ? ? ?

## 2021-03-25 NOTE — Plan of Care (Signed)

## 2021-03-25 NOTE — Op Note (Signed)
Preop diagnosis: Recurrent follicular thyroid cancer ?Postop diagnosis: same ?Procedure: Excision of right thyroid bed malignant mass ?Surgeon: Redmond Baseman ?Assist: RNFA ?Anesth: General and local with 1% lidocaine with 1:100,000 epinephrine ?Compl: None ?Findings: Bilobed mass to right of midline lying over strap muscles.  Recurrent laryngeal nerves were not encountered. ?Description:  After discussing risks, benefits, and alternatives, the patient was brought to the operative suite and placed on the operative table in the supine position.  Anesthesia was induced and the patient was intubated by the anesthesia team without difficulty using a nerve monitoring endotracheal tube that was monitored during the case.  The eyes were taped closed and a shoulder roll was placed.  The thyroidectomy scar was injected with local anesthetic.  The neck was prepped and draped in sterile fashion.  The incision was made with a 15 blade scalpel and extended through the subcutaneous and platysma layers with Bovie electrocautery.  Soft tissues were dissected from over the palpable mass.  Careful dissection was then performed around the periphery of the mass, primarily dividing muscle from the mass.  This continued until the mass was freed down to a midline extension toward the trachea that was dissected free.  The mass was passed to nursing for pathology.  Bleeding was controlled with bipolar electrocautery and ligation.  The wound was copiously irrigated with saline.  The platysma layer was closed with 3-0 Vicryl in a simple, interrupted fashion.  The subcutaneous layer was similarly closed with 4-0 Vicryl and the skin was closed with glue.  Drapes were removed and the patient was cleaned off.  She was returned to anesthesia for wake-up, was extubated, and moved to the recovery room in stable condition. ? ?

## 2021-03-25 NOTE — Discharge Instructions (Signed)
Watch for signs of low calcium, tingling around mouth or in fingers or cramping of muscles in hands or legs.  Call Dr. Redmond Baseman if you experience these symptoms. ?

## 2021-03-25 NOTE — Brief Op Note (Signed)
03/25/2021 ? ?1:32 PM ? ?PATIENT:  Amy Ellison  56 y.o. female ? ?PRE-OPERATIVE DIAGNOSIS:  Recurrent follicular carcinoma of thyroid gland; Thyroid nodule ? ?POST-OPERATIVE DIAGNOSIS:  Recurrent follicular carcinoma of thyroid gland; Thyroid nodule ? ?PROCEDURE:  Procedure(s): ?EXCISION OF THYROID BED MALIGNANT MASS (Right) ? ?SURGEON:  Surgeon(s) and Role: ?   Melida Quitter, MD - Primary ? ?PHYSICIAN ASSISTANT:  ? ?ASSISTANTS: RNFA  ? ?ANESTHESIA:   general ? ?EBL:  25 mL  ? ?BLOOD ADMINISTERED:none ? ?DRAINS: none  ? ?LOCAL MEDICATIONS USED:  LIDOCAINE  ? ?SPECIMEN:  Source of Specimen:  Right thyroid bed mass ? ?DISPOSITION OF SPECIMEN:  PATHOLOGY ? ?COUNTS:  YES ? ?TOURNIQUET:  * No tourniquets in log * ? ?DICTATION: .Note written in EPIC ? ?PLAN OF CARE: Admit for overnight observation ? ?PATIENT DISPOSITION:  PACU - hemodynamically stable. ?  ?Delay start of Pharmacological VTE agent (>24hrs) due to surgical blood loss or risk of bleeding: no ? ?

## 2021-03-26 ENCOUNTER — Encounter (HOSPITAL_COMMUNITY): Payer: Self-pay | Admitting: Otolaryngology

## 2021-03-27 LAB — SURGICAL PATHOLOGY

## 2021-04-02 ENCOUNTER — Other Ambulatory Visit: Payer: Self-pay | Admitting: Internal Medicine

## 2021-04-02 DIAGNOSIS — Z780 Asymptomatic menopausal state: Secondary | ICD-10-CM

## 2021-04-03 ENCOUNTER — Other Ambulatory Visit: Payer: Self-pay | Admitting: Internal Medicine

## 2021-04-03 DIAGNOSIS — S62109S Fracture of unspecified carpal bone, unspecified wrist, sequela: Secondary | ICD-10-CM

## 2021-04-03 DIAGNOSIS — Z780 Asymptomatic menopausal state: Secondary | ICD-10-CM

## 2021-08-24 ENCOUNTER — Other Ambulatory Visit (HOSPITAL_COMMUNITY): Payer: Self-pay | Admitting: Internal Medicine

## 2021-08-24 DIAGNOSIS — C73 Malignant neoplasm of thyroid gland: Secondary | ICD-10-CM

## 2021-08-24 NOTE — Written Directive (Cosign Needed)
MOLECULAR IMAGING AND THERAPEUTICS WRITTEN DIRECTIVE   PATIENT NAME: Amy Ellison  PT DOB:   February 13, 1965                                              MRN: [id number]  ---------------------------------------------------------------------------------------------------------------------   I-131 THYROID CANCER THERAPY   RADIOPHARMACEUTICAL:  Iodine-131 Capsule    PRESCRIBED DOSE FOR ADMINISTRATION: 150 mCi   ROUTE OFADMINISTRATION:  PO   DIAGNOSIS:Follicular carcinoma of thyroid    REFERRING PHYSICIAN: Dr. Delrae Rend   THYROGEN STIMULATION OR HORMONE WITHDRAW: hormone withdraw   REMNANT ABLATION OR ADJUVANT THERAPY: adjuvant therapy    DATE OF THYROIDECTOMY:November 2018   SURGEON: [facility] Dr. Ronald Pippins   TSH:   No results found for: "TSH"   PRIOR I-131 THERAPY (Date and Dose):03/04/2017 100.19mi   Pathology:  Cell type: '[]'$   Papillary  '[]'$   Follicular  '[x]'$   Hurthle   Largest tumor focus:  4.4    cm - recurrent thyroid mass - complete resection  Extrathyroidal Extension?     Yes '[]'$   No '[]'$     Lymphovascular Invasion?  Yes  '[]'$   No  '[]'$     Margins positive ? Yes '[]'$   No '[]'$     Lymph nodes positive? 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 Complete resection of recurrent mass in neck.  High dose treatment (per [name] for recurrent thyroid cancer.   [name]   08/27/21    3:38 PM

## 2021-09-03 ENCOUNTER — Encounter (HOSPITAL_COMMUNITY)
Admission: RE | Admit: 2021-09-03 | Discharge: 2021-09-03 | Disposition: A | Discharge status: Home / Self Care | Payer: Managed Care, Other (non HMO) | Source: Ambulatory Visit | Attending: Internal Medicine | Admitting: Internal Medicine

## 2021-09-03 DIAGNOSIS — C73 Malignant neoplasm of thyroid gland: Secondary | ICD-10-CM | POA: Diagnosis present

## 2021-09-03 MED ORDER — SODIUM IODIDE I 131 CAPSULE
149.0000 | Freq: Once | INTRAVENOUS | Status: AC | PRN
  Administered 2021-09-03: 149 via ORAL

## 2021-09-14 ENCOUNTER — Encounter (HOSPITAL_COMMUNITY)
Admission: RE | Admit: 2021-09-14 | Discharge: 2021-09-14 | Disposition: A | Discharge status: Home / Self Care | Payer: Managed Care, Other (non HMO) | Source: Ambulatory Visit | Attending: Internal Medicine | Admitting: Internal Medicine

## 2021-09-14 DIAGNOSIS — C73 Malignant neoplasm of thyroid gland: Secondary | ICD-10-CM | POA: Insufficient documentation

## 2021-09-18 ENCOUNTER — Other Ambulatory Visit: Payer: Managed Care, Other (non HMO)

## 2022-01-05 ENCOUNTER — Other Ambulatory Visit: Payer: Self-pay | Admitting: Internal Medicine

## 2022-01-05 DIAGNOSIS — C73 Malignant neoplasm of thyroid gland: Secondary | ICD-10-CM

## 2022-01-08 ENCOUNTER — Other Ambulatory Visit: Payer: Self-pay | Admitting: Internal Medicine

## 2022-01-08 DIAGNOSIS — C73 Malignant neoplasm of thyroid gland: Secondary | ICD-10-CM

## 2022-01-22 ENCOUNTER — Other Ambulatory Visit: Payer: Managed Care, Other (non HMO)

## 2022-02-04 ENCOUNTER — Ambulatory Visit
Admission: RE | Admit: 2022-02-04 | Discharge: 2022-02-04 | Disposition: A | Discharge status: Home / Self Care | Payer: Managed Care, Other (non HMO) | Source: Ambulatory Visit | Attending: Internal Medicine | Admitting: Internal Medicine

## 2022-02-04 DIAGNOSIS — C73 Malignant neoplasm of thyroid gland: Secondary | ICD-10-CM

## 2022-02-11 ENCOUNTER — Other Ambulatory Visit: Payer: Managed Care, Other (non HMO)

## 2022-02-16 ENCOUNTER — Ambulatory Visit
Admission: RE | Admit: 2022-02-16 | Discharge: 2022-02-16 | Disposition: A | Discharge status: Home / Self Care | Payer: Managed Care, Other (non HMO) | Source: Ambulatory Visit | Attending: Internal Medicine | Admitting: Internal Medicine

## 2022-02-16 DIAGNOSIS — C73 Malignant neoplasm of thyroid gland: Secondary | ICD-10-CM

## 2022-02-16 MED ORDER — IOPAMIDOL (ISOVUE-300) INJECTION 61%
75.0000 mL | Freq: Once | INTRAVENOUS | Status: AC | PRN
  Administered 2022-02-16: 75 mL via INTRAVENOUS

## 2022-04-20 ENCOUNTER — Encounter: Payer: Self-pay | Admitting: Otolaryngology

## 2022-05-11 ENCOUNTER — Other Ambulatory Visit: Payer: Self-pay | Admitting: Otolaryngology

## 2022-05-11 DIAGNOSIS — E041 Nontoxic single thyroid nodule: Secondary | ICD-10-CM

## 2022-06-09 ENCOUNTER — Other Ambulatory Visit: Payer: Self-pay

## 2022-06-09 ENCOUNTER — Other Ambulatory Visit: Payer: Self-pay | Admitting: Otolaryngology

## 2022-06-09 ENCOUNTER — Other Ambulatory Visit (HOSPITAL_COMMUNITY)
Admission: RE | Admit: 2022-06-09 | Discharge: 2022-06-09 | Disposition: A | Discharge status: Home / Self Care | Payer: Managed Care, Other (non HMO) | Source: Ambulatory Visit | Attending: Internal Medicine | Admitting: Internal Medicine

## 2022-06-09 ENCOUNTER — Ambulatory Visit
Admission: RE | Admit: 2022-06-09 | Discharge: 2022-06-09 | Disposition: A | Discharge status: Home / Self Care | Payer: Managed Care, Other (non HMO) | Source: Ambulatory Visit | Attending: Otolaryngology | Admitting: Otolaryngology

## 2022-06-09 ENCOUNTER — Ambulatory Visit
Admission: RE | Admit: 2022-06-09 | Discharge: 2022-06-09 | Disposition: A | Discharge status: Home / Self Care | Payer: Managed Care, Other (non HMO) | Source: Ambulatory Visit | Attending: Internal Medicine | Admitting: Internal Medicine

## 2022-06-09 ENCOUNTER — Other Ambulatory Visit (HOSPITAL_COMMUNITY): Payer: Self-pay | Admitting: Internal Medicine

## 2022-06-09 DIAGNOSIS — E041 Nontoxic single thyroid nodule: Secondary | ICD-10-CM | POA: Diagnosis not present

## 2022-06-09 DIAGNOSIS — C73 Malignant neoplasm of thyroid gland: Secondary | ICD-10-CM

## 2022-06-09 DIAGNOSIS — Z85850 Personal history of malignant neoplasm of thyroid: Secondary | ICD-10-CM | POA: Diagnosis present

## 2022-06-15 LAB — CYTOLOGY - NON PAP

## 2022-06-18 LAB — CYTOLOGY - NON PAP

## 2022-06-28 ENCOUNTER — Encounter (HOSPITAL_COMMUNITY): Payer: Self-pay

## 2022-08-25 ENCOUNTER — Other Ambulatory Visit: Payer: Self-pay | Admitting: Internal Medicine

## 2022-08-25 DIAGNOSIS — C73 Malignant neoplasm of thyroid gland: Secondary | ICD-10-CM
